# Patient Record
Sex: Female | Born: 1985 | Race: White | Hispanic: No | Smoking: Never smoker
Health system: Southern US, Community
[De-identification: ages and names within clinical notes are randomized; demographics above are authoritative.]

## PROBLEM LIST (undated history)

## (undated) DIAGNOSIS — M1990 Unspecified osteoarthritis, unspecified site: Secondary | ICD-10-CM

## (undated) HISTORY — PX: CHOLECYSTECTOMY: SHX55

---
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
## 2006-05-20 ENCOUNTER — Other Ambulatory Visit: Payer: Self-pay

## 2006-05-20 ENCOUNTER — Emergency Department: Payer: Self-pay | Admitting: Emergency Medicine

## 2006-05-21 ENCOUNTER — Emergency Department: Payer: Self-pay | Admitting: Emergency Medicine

## 2006-05-21 ENCOUNTER — Other Ambulatory Visit: Payer: Self-pay

## 2006-08-22 ENCOUNTER — Other Ambulatory Visit: Payer: Self-pay

## 2006-08-22 ENCOUNTER — Emergency Department: Payer: Self-pay | Admitting: Internal Medicine

## 2006-10-29 ENCOUNTER — Observation Stay: Payer: Self-pay

## 2006-10-30 ENCOUNTER — Ambulatory Visit: Payer: Self-pay

## 2006-10-31 ENCOUNTER — Observation Stay: Payer: Self-pay | Admitting: Obstetrics and Gynecology

## 2006-11-05 ENCOUNTER — Observation Stay: Payer: Self-pay

## 2006-11-16 ENCOUNTER — Observation Stay: Payer: Self-pay | Admitting: Certified Nurse Midwife

## 2006-12-19 ENCOUNTER — Observation Stay: Payer: Self-pay | Admitting: Obstetrics and Gynecology

## 2007-01-03 ENCOUNTER — Observation Stay: Payer: Self-pay | Admitting: Obstetrics and Gynecology

## 2007-01-09 ENCOUNTER — Observation Stay: Payer: Self-pay | Admitting: Obstetrics and Gynecology

## 2007-01-10 ENCOUNTER — Observation Stay: Payer: Self-pay | Admitting: Obstetrics and Gynecology

## 2007-01-11 ENCOUNTER — Inpatient Hospital Stay: Payer: Self-pay | Admitting: Obstetrics and Gynecology

## 2008-08-26 ENCOUNTER — Observation Stay: Payer: Self-pay | Admitting: Obstetrics and Gynecology

## 2008-09-05 ENCOUNTER — Observation Stay: Payer: Self-pay | Admitting: Unknown Physician Specialty

## 2008-09-09 ENCOUNTER — Observation Stay: Payer: Self-pay

## 2008-09-25 ENCOUNTER — Observation Stay: Payer: Self-pay | Admitting: Unknown Physician Specialty

## 2008-09-28 ENCOUNTER — Ambulatory Visit: Payer: Self-pay | Admitting: Obstetrics & Gynecology

## 2008-09-29 ENCOUNTER — Inpatient Hospital Stay: Payer: Self-pay | Admitting: Obstetrics & Gynecology

## 2011-11-30 ENCOUNTER — Encounter: Payer: Self-pay | Admitting: Obstetrics & Gynecology

## 2011-12-05 HISTORY — PX: TUBAL LIGATION: SHX77

## 2012-02-15 ENCOUNTER — Encounter: Payer: Self-pay | Admitting: Obstetrics & Gynecology

## 2012-06-18 ENCOUNTER — Observation Stay: Payer: Self-pay | Admitting: Obstetrics and Gynecology

## 2012-06-19 ENCOUNTER — Ambulatory Visit: Payer: Self-pay | Admitting: Obstetrics and Gynecology

## 2012-06-19 LAB — CBC WITH DIFFERENTIAL/PLATELET
Basophil #: 0 10*3/uL (ref 0.0–0.1)
Eosinophil %: 0.4 %
HCT: 28.3 % — ABNORMAL LOW (ref 35.0–47.0)
Lymphocyte #: 1.5 10*3/uL (ref 1.0–3.6)
Lymphocyte %: 15.9 %
MCH: 22.4 pg — ABNORMAL LOW (ref 26.0–34.0)
MCV: 71 fL — ABNORMAL LOW (ref 80–100)
Monocyte #: 0.7 x10 3/mm (ref 0.2–0.9)
Monocyte %: 7.5 %
Neutrophil #: 7.2 10*3/uL — ABNORMAL HIGH (ref 1.4–6.5)
RBC: 3.96 10*6/uL (ref 3.80–5.20)
RDW: 16.5 % — ABNORMAL HIGH (ref 11.5–14.5)
WBC: 9.5 10*3/uL (ref 3.6–11.0)

## 2012-06-20 ENCOUNTER — Inpatient Hospital Stay: Payer: Self-pay | Admitting: Obstetrics and Gynecology

## 2012-06-21 LAB — HEMATOCRIT: HCT: 24 % — ABNORMAL LOW (ref 35.0–47.0)

## 2015-03-23 NOTE — Op Note (Signed)
PATIENT NAME:  Dawn Paul, Dawn Paul MR#:  [id number] DATE OF BIRTH:  11-17-1986  DATE OF PROCEDURE:  06/20/2012  PREOPERATIVE DIAGNOSES: Term intrauterine pregnancy, prior history of cesarean section, desire for permanent sterility.      POSTOPERATIVE DIAGNOSES:  Term intrauterine pregnancy, prior history of cesarean section, desire for permanent sterility.   PROCEDURES PERFORMED:  1. Low transverse cesarean section.  2. Bilateral tubal ligation.   SURGEON: Dierdre Searles. Paul Harris, M.Paul.   ASSISTANT: Thomasene MohairStephen Jackson, M.Paul.   ANESTHESIA: Spinal.   ESTIMATED BLOOD LOSS: 250 mL.  COMPLICATIONS: None.   FINDINGS: Normal tubes, and uterus. Moderate amount of adhesions in the lower uterine segment. Delivery of viable female infant named Dawn Paul with Apgar scores of 9 and 9 and weight of 7 pounds, 5 ounces.   DISPOSITION: To the recovery room in stable condition.   TECHNIQUE: The patient is prepped and draped in the usual sterile fashion after adequate anesthesia is obtained in the supine position on the Operating Room table with a Foley catheter in place. The scalpel was used to create a low transverse skin incision through the area of the prior scar that extended down to the level of the rectus fascia which is then dissected bilaterally using Mayo scissors. The rectus muscles are separated from the rectus fascia and then separated in the midline and the peritoneum is penetrated with inferior dissection and retraction of the bladder. Adhesions are dissected to free up the lower uterine segment with no injury to bladder or other structures.   A scalpel is used to create a low transverse hysterotomy incision that is extended by blunt dissection with amniotomy then revealing clear fluid. The infant's head is grasped and delivered without the use of a vacuum suction device and the oropharynx is suctioned. The nuchal cord is reduced and the infant is then completely delivered.   Cord blood is obtained, the  placenta is manually extracted. The uterus is externalized and cleansed of all membranes and debris using a moist sponge. The hysterotomy incision is closed with a running #1 Vicryl suture in a locking fashion with a second suture placed to imbricate and obtain excellent hemostasis.   The right and left fallopian tubes are grasped in the midportion with a Babcock clamp and suture ligated and excised with the ends cauterized. Excellent hemostasis is noted at these sites. The uterus is placed back in the intra-abdominal cavity and the paracolic gutters are irrigated with warm saline. Re-examination of all incision reveals excellent hemostasis and Interceed is placed over the lower uterine segment at hysterotomy scar to minimize adhesion formation.   The peritoneum is closed with a Vicryl suture. Trocars are placed through the skin into the subfascial space and then Silver soaker catheters are fed into the subfascial space for administration of local anesthesia from the On-Q pain pump. The rectus fascia is then closed with a 0 Maxon suture and the subcutaneous tissues are irrigated and hemostasis is assured using electrocautery. Skin is closed with a 4-0 Vicryl suture in a subcuticular fashion followed by placement of Dermabond over this incision as well as where the catheters exit the skin. The two catheters are then flushed with 5 mL of 0.5% bupivacaine. The patient goes to the recovery room in stable condition. All sponge, instrument, and needle counts are correct.   ____________________________ R. Annamarie MajorPaul Harris, MD rph:ap Paul: 06/20/2012 08:55:13 ET T: 06/20/2012 10:41:01 ET JOB#: [id number]  cc: Dierdre Searles. Paul Harris, MD, <Dictator> Nadara MustardOBERT P HARRIS MD ELECTRONICALLY SIGNED 06/20/2012 10:52

## 2016-04-19 ENCOUNTER — Other Ambulatory Visit: Payer: Self-pay | Admitting: Family Medicine

## 2016-04-19 DIAGNOSIS — R1011 Right upper quadrant pain: Secondary | ICD-10-CM

## 2016-04-25 ENCOUNTER — Ambulatory Visit
Admission: RE | Admit: 2016-04-25 | Discharge: 2016-04-25 | Disposition: A | Discharge status: Home / Self Care | Payer: Medicaid Other | Source: Ambulatory Visit | Attending: Family Medicine | Admitting: Family Medicine

## 2016-04-25 DIAGNOSIS — Z9049 Acquired absence of other specified parts of digestive tract: Secondary | ICD-10-CM | POA: Diagnosis not present

## 2016-04-25 DIAGNOSIS — R1011 Right upper quadrant pain: Secondary | ICD-10-CM | POA: Diagnosis present

## 2019-06-09 ENCOUNTER — Ambulatory Visit
Admission: EM | Admit: 2019-06-09 | Discharge: 2019-06-09 | Disposition: A | Discharge status: Home / Self Care | Payer: Worker's Compensation | Attending: Family Medicine | Admitting: Family Medicine

## 2019-06-09 ENCOUNTER — Ambulatory Visit (INDEPENDENT_AMBULATORY_CARE_PROVIDER_SITE_OTHER): Payer: Worker's Compensation

## 2019-06-09 ENCOUNTER — Other Ambulatory Visit: Payer: Self-pay

## 2019-06-09 DIAGNOSIS — Z042 Encounter for examination and observation following work accident: Secondary | ICD-10-CM

## 2019-06-09 DIAGNOSIS — W231XXA Caught, crushed, jammed, or pinched between stationary objects, initial encounter: Secondary | ICD-10-CM | POA: Diagnosis not present

## 2019-06-09 DIAGNOSIS — M79644 Pain in right finger(s): Secondary | ICD-10-CM

## 2019-06-09 DIAGNOSIS — S6991XA Unspecified injury of right wrist, hand and finger(s), initial encounter: Secondary | ICD-10-CM | POA: Diagnosis not present

## 2019-06-09 HISTORY — DX: Unspecified osteoarthritis, unspecified site: M19.90

## 2019-06-09 MED ORDER — MELOXICAM 15 MG PO TABS: 15.0000 mg | ORAL_TABLET | Freq: Every day | ORAL | 0 refills | Status: DC | PRN

## 2019-06-09 NOTE — ED Provider Notes (Signed)
[facility]    CSN: [id number] Arrival date & time: 06/09/19  1200   History   Chief Complaint Chief Complaint  Patient presents with  . Finger Injury    right middle finger  . Work Related Injury    HPI  33 year old female presents with a work-related injury to her right middle finger.  Patient reports that yesterday she was at work.  She got her finger caught in a crate.  She states that she heard a pop of her right middle finger.  She endorses pain, swelling below the PIP joint.  She has soaked the area and took Tylenol without resolution.  Exacerbated by range of motion.  No relieving factors.  No other associated symptoms.  No other complaints.  PMH, Surgical Hx, Family Hx, Social History reviewed and updated as below.  Past Medical History:  Diagnosis Date  . Arthritis    Past Surgical History:  Procedure Laterality Date  . CESAREAN SECTION    . CHOLECYSTECTOMY      OB History   No obstetric history on file.      Home Medications    Prior to Admission medications   Medication Sig Start Date End Date Taking? Authorizing Provider  acetaminophen (TYLENOL) 650 MG CR tablet Take 650 mg by mouth every 8 (eight) hours as needed for pain.   Yes [provider]  albuterol (VENTOLIN HFA) 108 (90 Base) MCG/ACT inhaler Inhale into the lungs.   Yes [provider]  ASHLYNA 0.15-0.03 &0.01 MG tablet TAKE 1 TABLET BY MOUTH ONCE DAILY FOR 3 MONTHS RESTART 1 WEEK AFTER LAST PILL IS TAKEN. 03/20/19  Yes [provider]  fluticasone (FLONASE) 50 MCG/ACT nasal spray  03/28/18  Yes [provider]  meloxicam (MOBIC) 15 MG tablet Take 1 tablet (15 mg total) by mouth daily as needed for pain. 06/09/19   Tommie Samsook, Joron Velis G, DO    Family History Family History  Problem Relation Age of Onset  . Hypertension Mother   . COPD Mother   . Supraventricular tachycardia Mother   . Lupus Mother   . Diabetes Father     Social History Social History    Tobacco Use  . Smoking status: Never Smoker  . Smokeless tobacco: Never Used  Substance Use Topics  . Alcohol use: Not Currently  . Drug use: Not Currently     Allergies   Penicillins   Review of Systems Review of Systems  Constitutional: Negative.   Musculoskeletal:       Right middle finger pain.   Physical Exam Triage Vital Signs ED Triage Vitals  Enc Vitals Group     BP 06/09/19 1240 123/68     Pulse Rate 06/09/19 1240 75     Resp 06/09/19 1240 17     Temp 06/09/19 1240 98.1 F (36.7 C)     Temp Source 06/09/19 1240 Oral     SpO2 06/09/19 1240 100 %     Weight 06/09/19 1236 190 lb (86.2 kg)     Height 06/09/19 1236 5\' 6"  (1.676 m)     Head Circumference --      Peak Flow --      Pain Score 06/09/19 1235 8     Pain Loc --      Pain Edu? --      Excl. in GC? --    Updated Vital Signs BP 123/68 (BP Location: Right Arm)   Pulse 75   Temp 98.1 F (36.7 C) (Oral)  Resp 17   Ht 5\' 6"  (1.676 m)   Wt 86.2 kg   SpO2 100%   BMI 30.67 kg/m   Visual Acuity Right Eye Distance:   Left Eye Distance:   Bilateral Distance:    Right Eye Near:   Left Eye Near:    Bilateral Near:     Physical Exam Vitals signs and nursing note reviewed.  Constitutional:      General: She is not in acute distress.    Appearance: Normal appearance.  HENT:     Head: Normocephalic and atraumatic.  Eyes:     General:        Right eye: No discharge.        Left eye: No discharge.     Conjunctiva/sclera: Conjunctivae normal.  Cardiovascular:     Rate and Rhythm: Normal rate and regular rhythm.  Pulmonary:     Effort: Pulmonary effort is normal.     Breath sounds: Normal breath sounds.  Musculoskeletal:     Comments: Right middle finger with tenderness just proximal to the PIP joint and at the MCP joint.  She is able to flex the finger but not fully.  She is able to extend as well.  Skin:    General: Skin is warm.     Findings: No bruising.  Neurological:     Mental  Status: She is alert.  Psychiatric:        Mood and Affect: Mood normal.        Behavior: Behavior normal.    UC Treatments / Results  Labs (all labs ordered are listed, but only abnormal results are displayed) Labs Reviewed - No data to display  EKG   Radiology Dg Finger Middle Right  Result Date: 06/09/2019 CLINICAL DATA:  Smashed finger at work.  Pain. EXAM: RIGHT MIDDLE FINGER 2+V COMPARISON:  None. FINDINGS: There is no evidence of fracture or dislocation. There is no evidence of arthropathy or other focal bone abnormality. Soft tissues are unremarkable. IMPRESSION: Negative. Electronically Signed   By: Kathreen Devoid   On: 06/09/2019 13:40    Procedures Procedures (including critical care time)  Medications Ordered in UC Medications - No data to display  Initial Impression / Assessment and Plan / UC Course  I have reviewed the triage vital signs and the nursing notes.  Pertinent labs & imaging results that were available during my care of the patient were reviewed by me and considered in my medical decision making (see chart for details).    33 year old female presents with a finger injury.  X-ray negative.  Meloxicam as directed.  Workmen's Comp. form filled out with restrictions.  Final Clinical Impressions(s) / UC Diagnoses   Final diagnoses:  Injury of finger of right hand, initial encounter     Discharge Instructions     Medication as prescribed.  Xray negative.  Take care  Dr. Lacinda Axon    ED Prescriptions    Medication Sig Dispense Auth. Provider   meloxicam (MOBIC) 15 MG tablet Take 1 tablet (15 mg total) by mouth daily as needed for pain. 30 tablet Coral Spikes, DO     Controlled Substance Prescriptions [place] Controlled Substance Registry consulted? Not Applicable   Coral Spikes, DO 06/09/19 1438

## 2019-06-09 NOTE — ED Triage Notes (Signed)
Patient complains of right middle finger injury that occurred at work yesterday. Patient states that she was putting a crate in to the cooler and her finger got stuck in the crate, heard a popping noise, pain and swelling at knuckle. Patient states that she soaked it in epsom salt and took tylenol arthritis.

## 2019-06-09 NOTE — Discharge Instructions (Signed)
Medication as prescribed.  Xray negative.  Take care  Dr. Robel Wuertz  

## 2020-03-30 ENCOUNTER — Other Ambulatory Visit: Payer: Self-pay | Admitting: Primary Care

## 2020-03-30 DIAGNOSIS — R1011 Right upper quadrant pain: Secondary | ICD-10-CM

## 2020-04-06 ENCOUNTER — Other Ambulatory Visit: Payer: Self-pay

## 2020-04-06 ENCOUNTER — Ambulatory Visit
Admission: RE | Admit: 2020-04-06 | Discharge: 2020-04-06 | Disposition: A | Discharge status: Home / Self Care | Payer: Medicaid Other | Source: Ambulatory Visit | Attending: Primary Care | Admitting: Primary Care

## 2020-04-06 DIAGNOSIS — R1011 Right upper quadrant pain: Secondary | ICD-10-CM | POA: Diagnosis not present

## 2020-04-12 ENCOUNTER — Encounter: Payer: Self-pay | Admitting: Obstetrics and Gynecology

## 2020-04-26 ENCOUNTER — Encounter: Payer: Self-pay | Admitting: Obstetrics and Gynecology

## 2020-05-25 ENCOUNTER — Encounter: Payer: Self-pay | Admitting: Obstetrics and Gynecology

## 2020-05-25 ENCOUNTER — Other Ambulatory Visit: Payer: Self-pay

## 2020-05-25 ENCOUNTER — Ambulatory Visit (INDEPENDENT_AMBULATORY_CARE_PROVIDER_SITE_OTHER): Payer: Medicaid Other | Admitting: Obstetrics and Gynecology

## 2020-05-25 ENCOUNTER — Other Ambulatory Visit (HOSPITAL_COMMUNITY)
Admission: RE | Admit: 2020-05-25 | Discharge: 2020-05-25 | Disposition: A | Discharge status: Home / Self Care | Payer: Medicaid Other | Source: Ambulatory Visit | Attending: Obstetrics and Gynecology | Admitting: Obstetrics and Gynecology

## 2020-05-25 VITALS — BP 110/66 | Wt 180.0 lb

## 2020-05-25 DIAGNOSIS — Z124 Encounter for screening for malignant neoplasm of cervix: Secondary | ICD-10-CM | POA: Diagnosis present

## 2020-05-25 DIAGNOSIS — Z113 Encounter for screening for infections with a predominantly sexual mode of transmission: Secondary | ICD-10-CM | POA: Diagnosis present

## 2020-05-25 DIAGNOSIS — N939 Abnormal uterine and vaginal bleeding, unspecified: Secondary | ICD-10-CM | POA: Diagnosis not present

## 2020-05-25 NOTE — Progress Notes (Signed)
Gynecology Abnormal Uterine Bleeding Initial Evaluation   Chief Complaint:  Chief Complaint  Patient presents with  . Menorrhagia    Referred by [facility] center    History of Present Illness:    Dawn Paul is a 34 y.o. [id number] who LMP was Patient's last menstrual period was 03/28/2020., presents today for a problem visit.  She complains of menorrhagia that  began several years ago and its severity is described as severe.  The patient menstrual complaints are chronic present for the oast 6 months but with recent acute exacerbation. She has regular menstrual cycles and they are associated with severe menstrual cramping.  She has used the following for attempts at control: tampon and pad. The patient is sexually active. She currently uses tubal ligationfor contraception.    Previous evaluation: No recent uterine imaging, last 2017 showing no evidence of fibroids, 5.21mm endometrial stripe, and normal adnexa.  Normal endometrial biopsy in 2017 showing secretory endometrium Prior Diagnosis: N/A. Previous Treatment: was placed on Lysteda in 2017  LMP: Patient's last menstrual period was 03/28/2020.   Paramter Normal / Abnormal Prsent  Frequency Amenoorhea     Infrequent (>38 days)     Normal (?24 days ?38 days) X   Freequent (<24 days)    Duration Normal (?8 days) X   Prolonged (>8 days)    Regularity Regular (shortest to longest cycle variation ?7-9 days)* X   Irregular (shortest to longest cycle variation ?8-10days)*    Flow Volume Light    (Self reported) Normal     Heavy X      Intermenstrual Bleeding None X   Random     Cyclical early     Cyclical mid     Cyclical late        Unscheduled Bleeding  Not applicable X  (exogenous hormones) Absent     Present     FIGO AUB I System: *The available evidence suggests that, using these criteria, the normal range (shortest to longest) varies with age: 71-25 y of age, ?18 d; 47-41 y, ?7 d; and for 13-45 y, ?9  d    Review of Systems: Review of Systems  Constitutional: Negative.   Gastrointestinal: Positive for abdominal pain.  Genitourinary: Negative.   Endo/Heme/Allergies: Does not bruise/bleed easily.    Past Medical History:  There are no problems to display for this patient.   Past Surgical History:  Past Surgical History:  Procedure Laterality Date  . CESAREAN SECTION    . CHOLECYSTECTOMY    . TUBAL LIGATION  2013    Obstetric History: [id number]  Family History:  Family History  Problem Relation Age of Onset  . Hypertension Mother   . COPD Mother   . Supraventricular tachycardia Mother   . Lupus Mother   . Diabetes Father     Social History:  Social History   Socioeconomic History  . Marital status: Divorced    Spouse name: Not on file  . Number of children: Not on file  . Years of education: Not on file  . Highest education level: Not on file  Occupational History  . Not on file  Tobacco Use  . Smoking status: Never Smoker  . Smokeless tobacco: Never Used  Vaping Use  . Vaping Use: Never used  Substance and Sexual Activity  . Alcohol use: Not Currently  . Drug use: Not Currently  . Sexual activity: Yes    Birth control/protection: Pill  Other Topics Concern  . Not on  file  Social History Narrative  . Not on file   Social Determinants of Health   Financial Resource Strain:   . Difficulty of Paying Living Expenses:   Food Insecurity:   . Worried About Charity fundraiser in the Last Year:   . Arboriculturist in the Last Year:   Transportation Needs:   . Film/video editor (Medical):   Marland Kitchen Lack of Transportation (Non-Medical):   Physical Activity:   . Days of Exercise per Week:   . Minutes of Exercise per Session:   Stress:   . Feeling of Stress :   Social Connections:   . Frequency of Communication with Friends and Family:   . Frequency of Social Gatherings with Friends and Family:   . Attends Religious Services:   . Active Member of Clubs  or Organizations:   . Attends Archivist Meetings:   Marland Kitchen Marital Status:   Intimate Partner Violence:   . Fear of Current or Ex-Partner:   . Emotionally Abused:   Marland Kitchen Physically Abused:   . Sexually Abused:     Allergies:  Allergies  Allergen Reactions  . Penicillins Swelling and Other (See Comments)    Other reaction(s): SWELLING/EDEMA     Medications: Prior to Admission medications   Medication Sig Start Date End Date Taking? Authorizing Provider  ASHLYNA 0.15-0.03 &0.01 MG tablet TAKE 1 TABLET BY MOUTH ONCE DAILY FOR 3 MONTHS RESTART 1 WEEK AFTER LAST PILL IS TAKEN. 03/20/19  Yes [provider]  fluticasone (FLONASE) 50 MCG/ACT nasal spray  03/28/18  Yes [provider]  meloxicam (MOBIC) 15 MG tablet Take 1 tablet (15 mg total) by mouth daily as needed for pain. 06/09/19  Yes Coral Spikes, DO    Physical Exam Blood pressure 110/66, weight 180 lb (81.6 kg), last menstrual period 03/28/2020.  Patient's last menstrual period was 03/28/2020.  General: NAD HEENT: normocephalic, anicteric Thyroid: no enlargement, no palpable nodules Pulmonary: No increased work of breathing Cardiovascular: RRR, distal pulses 2+ Abdomen: NABS, soft, non-tender, non-distended.  Umbilicus without lesions.  No hepatomegaly, splenomegaly or masses palpable. No evidence of hernia  Genitourinary:  External: Normal external female genitalia.  Normal urethral meatus, normal Bartholin's and Skene's glands.    Vagina: Normal vaginal mucosa, no evidence of prolapse.    Cervix: Grossly normal in appearance, no bleeding  Uterus: Enlarged 14 weeks size, anteverted, mobile, slightly irregular contour.  No CMT  Adnexa: ovaries non-enlarged, no adnexal masses  Rectal: deferred  Lymphatic: no evidence of inguinal lymphadenopathy Extremities: no edema, erythema, or tenderness Neurologic: Grossly intact Psychiatric: mood appropriate, affect full  Female chaperone present for pelvic  portions of the physical exam  Assessment: 34 y.o. [id number] with abnormal uterine bleeding  Plan: Problem List Items Addressed This Visit    None    Visit Diagnoses    Abnormal uterine bleeding    -  Primary   Relevant Orders   TSH+Prl+FSH+TestT+LH+DHEA S...   US Transvaginal Non-OB   Screening for malignant neoplasm of cervix       Relevant Orders   Cytology - PAP   Routine screening for STI (sexually transmitted infection)       Relevant Orders   Cytology - PAP      1) Discussed management options for abnormal uterine bleeding including expectant, NSAIDs, tranexamic acid (Lysteda), oral progesterone (Provera, norethindrone, megace), Depo Provera, Levonorgestrel containing IUD, endometrial ablation (Novasure) or hysterectomy as definitive surgical management.  Discussed risks and benefits  of each method.   Final management decision will hinge on results of patient's work up and whether an underlying etiology for the patients bleeding symptoms can be discerned.  We will conduct a basic work up examining using the PALM-COIEN classification system. .  The role of unopposed estrogen in the development of endometrial hyperplasia or carcinoma is discussed.  The risk of endometrial hyperplasia is linearly correlated with increasing BMI given the production of estrone by adipose tissue. Printed patient education handouts were given to the patient to review at home.  Bleeding precautions reviewed.  - pap obtained today - will obtain imaging to further evaluate for structural lesions such as uterine fibroids - treatment option such as progestin only options (POP, IUD, depo provera) discussed given migraine with auro not candidate for OCP, endometrial ablation, ponstel, lysteda (previously tried), or hysterectomy.  If uterine fibroids visualized [place] may also be an option. - In the setting of normal imaging suspicion for adenomyosis high given history of prior cesarean sections.  2) Return in about 1  week (around 06/01/2020) for 1-2 week TVUS and gyn follow up.   Vena Austria, MD, [facility], [facility] 05/25/2020, 3:16 PM

## 2020-05-28 LAB — CYTOLOGY - PAP
Comment: NEGATIVE
Diagnosis: NEGATIVE
High risk HPV: NEGATIVE

## 2020-06-01 LAB — TSH+PRL+FSH+TESTT+LH+DHEA S...
17-Hydroxyprogesterone: <10 ng/dL
Androstenedione: <25 ng/dL — ABNORMAL LOW (ref 41–262)
DHEA-SO4: 74.7 ug/dL — ABNORMAL LOW (ref 84.8–378.0)
FSH: <0.3 m[IU]/mL
LH: <0.3 m[IU]/mL
Prolactin: 6.9 ng/mL (ref 4.8–23.3)
TSH: 1.36 u[IU]/mL (ref 0.450–4.500)
Testosterone, Free: 0.5 pg/mL (ref 0.0–4.2)
Testosterone: <3 ng/dL — ABNORMAL LOW (ref 8–60)

## 2020-06-15 ENCOUNTER — Other Ambulatory Visit: Payer: Self-pay | Admitting: Obstetrics and Gynecology

## 2020-06-15 ENCOUNTER — Other Ambulatory Visit: Payer: Self-pay

## 2020-06-15 ENCOUNTER — Ambulatory Visit (INDEPENDENT_AMBULATORY_CARE_PROVIDER_SITE_OTHER): Payer: Medicaid Other

## 2020-06-15 ENCOUNTER — Encounter: Payer: Self-pay | Admitting: Obstetrics and Gynecology

## 2020-06-15 ENCOUNTER — Ambulatory Visit (INDEPENDENT_AMBULATORY_CARE_PROVIDER_SITE_OTHER): Payer: Medicaid Other | Admitting: Obstetrics and Gynecology

## 2020-06-15 VITALS — BP 112/68 | HR 78 | Ht 66.0 in | Wt 179.0 lb

## 2020-06-15 DIAGNOSIS — N939 Abnormal uterine and vaginal bleeding, unspecified: Secondary | ICD-10-CM

## 2020-06-15 DIAGNOSIS — N946 Dysmenorrhea, unspecified: Secondary | ICD-10-CM

## 2020-06-15 MED ORDER — ASHLYNA 0.15-0.03 &0.01 MG PO TABS: 1.0000 | ORAL_TABLET | Freq: Every day | ORAL | 3 refills | Status: DC

## 2020-06-15 NOTE — Progress Notes (Signed)
Gynecology Ultrasound Follow Up  Chief Complaint:  Chief Complaint  Patient presents with  . Follow-up    GYN ultrasound     History of Present Illness: Patient is a 34 y.o. female who presents today for ultrasound evaluation of abnormal uterine bleeding dysmenorrhyea .  Ultrasound demonstrates the following findgins Adnexa: no masses seen  Uterus: Non-enlarged with endometrial stripe  11 mm Additional: No free fluid  Review of Systems: Review of Systems  Constitutional: Negative.   Gastrointestinal: Negative for nausea and vomiting.  Neurological: Positive for headaches.    Past Medical History:  Past Medical History:  Diagnosis Date  . Arthritis     Past Surgical History:  Past Surgical History:  Procedure Laterality Date  . CESAREAN SECTION    . CHOLECYSTECTOMY    . TUBAL LIGATION  2013    Gynecologic History:  No LMP recorded. (Menstrual status: Oral contraceptives). Contraception: tubal ligation Last Pap: 05/25/2020. Results were: .NILM HPV negative  Family History:  Family History  Problem Relation Age of Onset  . Hypertension Mother   . COPD Mother   . Supraventricular tachycardia Mother   . Lupus Mother   . Diabetes Father     Social History:  Social History   Socioeconomic History  . Marital status: Divorced    Spouse name: Not on file  . Number of children: Not on file  . Years of education: Not on file  . Highest education level: Not on file  Occupational History  . Not on file  Tobacco Use  . Smoking status: Never Smoker  . Smokeless tobacco: Never Used  Vaping Use  . Vaping Use: Never used  Substance and Sexual Activity  . Alcohol use: Not Currently  . Drug use: Not Currently  . Sexual activity: Yes    Birth control/protection: Pill  Other Topics Concern  . Not on file  Social History Narrative  . Not on file   Social Determinants of Health   Financial Resource Strain:   . Difficulty of Paying Living Expenses:   Food  Insecurity:   . Worried About Programme researcher, broadcasting/film/video in the Last Year:   . Barista in the Last Year:   Transportation Needs:   . Freight forwarder (Medical):   Marland Kitchen Lack of Transportation (Non-Medical):   Physical Activity:   . Days of Exercise per Week:   . Minutes of Exercise per Session:   Stress:   . Feeling of Stress :   Social Connections:   . Frequency of Communication with Friends and Family:   . Frequency of Social Gatherings with Friends and Family:   . Attends Religious Services:   . Active Member of Clubs or Organizations:   . Attends Banker Meetings:   Marland Kitchen Marital Status:   Intimate Partner Violence:   . Fear of Current or Ex-Partner:   . Emotionally Abused:   Marland Kitchen Physically Abused:   . Sexually Abused:     Allergies:  Allergies  Allergen Reactions  . Penicillins Swelling and Other (See Comments)    Other reaction(s): SWELLING/EDEMA     Medications: Prior to Admission medications   Medication Sig Start Date End Date Taking? Authorizing Provider  ASHLYNA 0.15-0.03 &0.01 MG tablet TAKE 1 TABLET BY MOUTH ONCE DAILY FOR 3 MONTHS RESTART 1 WEEK AFTER LAST PILL IS TAKEN. 03/20/19  Yes [provider]  fluticasone (FLONASE) 50 MCG/ACT nasal spray  03/28/18  Yes [provider]  pantoprazole (  PROTONIX) 40 MG tablet Take 40 mg by mouth daily. 05/28/20  Yes [provider]    Physical Exam Vitals: Blood pressure 112/68, pulse 78, height 5\' 6"  (1.676 m), weight 179 lb (81.2 kg).  General: NAD HEENT: normocephalic, anicteric Pulmonary: No increased work of breathing Extremities: no edema, erythema, or tenderness Neurologic: Grossly intact, normal gait Psychiatric: mood appropriate, affect full   Assessment: 34 y.o. 33 No problem-specific Assessment & Plan notes found for this encounter.   Plan: Problem List Items Addressed This Visit    None      1) Discussed option including expectant, ponstel, progestin only  contraceptives including IUD, ablation, and hysterectomy.  At present opts for continuous dosed current OCP will assess response in 2 months.  She has been on her current contraceptive for sometime with no worsening in menstrual migraines.  This already is an extended cycle pill and we discussed switching to po progestin if inadequate response.  Given normal imaging and labs highest suspicion is for adenomyosis or endometriosis given dysmenorrhea.  Previously tried lysteda,  2) A total of 15 minutes were spent in face-to-face contact with the patient during this encounter with over half of that time devoted to counseling and coordination of care.  3) Return in about 2 months (around 08/16/2020) for medication follow up in person or phone.    08/18/2020, MD, Vena Austria OB[facility], [facility] 06/15/2020, 4:26 PM

## 2020-08-16 ENCOUNTER — Ambulatory Visit (INDEPENDENT_AMBULATORY_CARE_PROVIDER_SITE_OTHER): Payer: Medicaid Other | Admitting: Obstetrics and Gynecology

## 2020-08-16 ENCOUNTER — Other Ambulatory Visit: Payer: Self-pay

## 2020-08-16 ENCOUNTER — Encounter: Payer: Self-pay | Admitting: Obstetrics and Gynecology

## 2020-08-16 VITALS — BP 120/76 | HR 99 | Wt 183.0 lb

## 2020-08-16 DIAGNOSIS — N946 Dysmenorrhea, unspecified: Secondary | ICD-10-CM

## 2020-08-16 MED ORDER — NORETHINDRONE ACETATE 5 MG PO TABS: 5.0000 mg | ORAL_TABLET | Freq: Every day | ORAL | 11 refills | Status: DC

## 2020-08-16 NOTE — Progress Notes (Signed)
Obstetrics & Gynecology Office Visit   Chief Complaint:  Chief Complaint  Patient presents with  . Follow-up    History of Present Illness: 34 y.o. [id number] presenting for medication follow up for a diagnosis of dysmenorrhea.  She is currently being managed with continuous OCP.   The patient reports good control of symptoms on her current regimen.  On her current medication regimen has achieved good cycle control, improvement in dysmenorrhea and dysparunia.   She has noted side-effects, namely worsening headaches as well as an increase in ACNE.    Review of Systems: Review of Systems  Constitutional: Negative.   Gastrointestinal: Negative.   Genitourinary: Negative.   Skin: Positive for rash. Negative for itching.     Past Medical History:  Past Medical History:  Diagnosis Date  . Arthritis     Past Surgical History:  Past Surgical History:  Procedure Laterality Date  . CESAREAN SECTION    . CHOLECYSTECTOMY    . TUBAL LIGATION  2013    Gynecologic History: No LMP recorded. (Menstrual status: Oral contraceptives).  Obstetric History: [id number]  Family History:  Family History  Problem Relation Age of Onset  . Hypertension Mother   . COPD Mother   . Supraventricular tachycardia Mother   . Lupus Mother   . Diabetes Father     Social History:  Social History   Socioeconomic History  . Marital status: Divorced    Spouse name: Not on file  . Number of children: Not on file  . Years of education: Not on file  . Highest education level: Not on file  Occupational History  . Not on file  Tobacco Use  . Smoking status: Never Smoker  . Smokeless tobacco: Never Used  Vaping Use  . Vaping Use: Never used  Substance and Sexual Activity  . Alcohol use: Not Currently  . Drug use: Not Currently  . Sexual activity: Yes    Birth control/protection: Pill  Other Topics Concern  . Not on file  Social History Narrative  . Not on file   Social Determinants of  Health   Financial Resource Strain:   . Difficulty of Paying Living Expenses: Not on file  Food Insecurity:   . Worried About Programme researcher, broadcasting/film/video in the Last Year: Not on file  . Ran Out of Food in the Last Year: Not on file  Transportation Needs:   . Lack of Transportation (Medical): Not on file  . Lack of Transportation (Non-Medical): Not on file  Physical Activity:   . Days of Exercise per Week: Not on file  . Minutes of Exercise per Session: Not on file  Stress:   . Feeling of Stress : Not on file  Social Connections:   . Frequency of Communication with Friends and Family: Not on file  . Frequency of Social Gatherings with Friends and Family: Not on file  . Attends Religious Services: Not on file  . Active Member of Clubs or Organizations: Not on file  . Attends Banker Meetings: Not on file  . Marital Status: Not on file  Intimate Partner Violence:   . Fear of Current or Ex-Partner: Not on file  . Emotionally Abused: Not on file  . Physically Abused: Not on file  . Sexually Abused: Not on file    Allergies:  Allergies  Allergen Reactions  . Penicillins Swelling and Other (See Comments)    Other reaction(s): SWELLING/EDEMA     Medications: Prior to Admission  medications   Medication Sig Start Date End Date Taking? Authorizing Provider  ASHLYNA 0.15-0.03 &0.01 MG tablet Take 1 tablet by mouth daily. Skip placebo pill and start next pill pack 06/15/20  Yes Vena Austria, MD  fluticasone [facility]) 50 MCG/ACT nasal spray  03/28/18  Yes [provider]  pantoprazole (PROTONIX) 40 MG tablet Take 40 mg by mouth daily. 05/28/20  Yes [provider]    Physical Exam Vitals:  Vitals:   08/16/20 0917  BP: 120/76  Pulse: 99   No LMP recorded. (Menstrual status: Oral contraceptives).  General: NAD, well nourished, appears stated age HEENT: normocephalic, anicteric Pulmonary: No increased work of breathing Neurologic: Grossly  intact Psychiatric: mood appropriate, affect full  Female chaperone present for pelvic  portions of the physical exam  Assessment: 34 y.o. G3P3003 follow up dysmenorrhea  Plan: Problem List Items Addressed This Visit    None    Visit Diagnoses    Dysmenorrhea    -  Primary     1) Dysmenorrhea - improvement of symptoms with extended cycle pill but worsening migraines as well as ACNE noted by patient.  Still some mild dysparunia but improvement overall.  Will switch to norethindrone to see if improvement in migraines on progestin on ly treatment  2) A total of 15 minutes were spent in face-to-face contact with the patient during this encounter with over half of that time devoted to counseling and coordination of care.  3) Return in about 3 months (around 11/15/2020) for medication follow up (phone or in person).   Vena Austria, MD, [facility], [facility] 08/16/2020, 9:49 AM

## 2020-10-01 ENCOUNTER — Other Ambulatory Visit: Payer: Self-pay | Admitting: Obstetrics and Gynecology

## 2020-10-01 ENCOUNTER — Telehealth: Payer: Self-pay

## 2020-10-01 MED ORDER — LEVONORGEST-ETH ESTRAD 91-DAY 0.15-0.03 &0.01 MG PO TABS: 1.0000 | ORAL_TABLET | Freq: Every day | ORAL | 3 refills | Status: AC

## 2020-10-01 NOTE — Telephone Encounter (Signed)
Rx is in

## 2020-10-01 NOTE — Telephone Encounter (Signed)
Patient has been on Aygestin for 2 months. She doesn't like it and would like to go back on Ashlyna. Requesting rx. RV#[phone]

## 2020-10-04 NOTE — Telephone Encounter (Signed)
LMVM to notify patient. 

## 2020-11-15 ENCOUNTER — Encounter: Payer: Self-pay | Admitting: Obstetrics and Gynecology

## 2020-11-15 ENCOUNTER — Ambulatory Visit (INDEPENDENT_AMBULATORY_CARE_PROVIDER_SITE_OTHER): Payer: Medicaid Other | Admitting: Obstetrics and Gynecology

## 2020-11-15 ENCOUNTER — Other Ambulatory Visit: Payer: Self-pay

## 2020-11-15 VITALS — BP 118/72 | Ht 66.0 in | Wt 191.0 lb

## 2020-11-15 DIAGNOSIS — K5900 Constipation, unspecified: Secondary | ICD-10-CM

## 2020-11-15 DIAGNOSIS — R5383 Other fatigue: Secondary | ICD-10-CM

## 2020-11-15 DIAGNOSIS — Z1321 Encounter for screening for nutritional disorder: Secondary | ICD-10-CM

## 2020-11-15 DIAGNOSIS — L853 Xerosis cutis: Secondary | ICD-10-CM

## 2020-11-15 DIAGNOSIS — R635 Abnormal weight gain: Secondary | ICD-10-CM

## 2020-11-15 DIAGNOSIS — R102 Pelvic and perineal pain: Secondary | ICD-10-CM

## 2020-11-15 DIAGNOSIS — N946 Dysmenorrhea, unspecified: Secondary | ICD-10-CM

## 2020-11-15 DIAGNOSIS — Z8349 Family history of other endocrine, nutritional and metabolic diseases: Secondary | ICD-10-CM

## 2020-11-15 DIAGNOSIS — N939 Abnormal uterine and vaginal bleeding, unspecified: Secondary | ICD-10-CM

## 2020-11-15 DIAGNOSIS — Z1329 Encounter for screening for other suspected endocrine disorder: Secondary | ICD-10-CM

## 2020-11-15 NOTE — Progress Notes (Signed)
Obstetrics & Gynecology Office Visit   Chief Complaint:  Chief Complaint  Patient presents with  . Follow-up    F/U medication - RM 4    History of Present Illness: 34 y.o. [id number] presenting for medication follow up for a diagnosis of dysmenorrhea and pelvic pain.  She is currently being managed with norethindrone.   The patient reports acute worsening in symptoms, has had continued breakthrough bleeding on norethindrone and switched back to extended cycle OCP.  On switching back to OCP bleeding subsided.  Has not had any issues with migraines since restarting OCP.   She has noted new symptoms. Fatigue has become fairly prominent, initially attributed to the 2 weeks of breakthrough bleeding.  Also noted dry skin, weight gain, and constipation.  Her mother has history of thyroid problems.     Review of Systems: Review of Systems  Constitutional: Positive for malaise/fatigue. Negative for chills, diaphoresis, fever and weight loss.  Gastrointestinal: Negative.   Genitourinary: Negative.   Skin: Positive for rash.     Past Medical History:  Past Medical History:  Diagnosis Date  . Arthritis     Past Surgical History:  Past Surgical History:  Procedure Laterality Date  . CESAREAN SECTION    . CHOLECYSTECTOMY    . TUBAL LIGATION  2013    Gynecologic History: No LMP recorded. (Menstrual status: Oral contraceptives).  Obstetric History: [id number]  Family History:  Family History  Problem Relation Age of Onset  . Hypertension Mother   . COPD Mother   . Supraventricular tachycardia Mother   . Lupus Mother   . Diabetes Father     Social History:  Social History   Socioeconomic History  . Marital status: Divorced    Spouse name: Not on file  . Number of children: Not on file  . Years of education: Not on file  . Highest education level: Not on file  Occupational History  . Not on file  Tobacco Use  . Smoking status: Never Smoker  . Smokeless tobacco: Never Used   Vaping Use  . Vaping Use: Never used  Substance and Sexual Activity  . Alcohol use: Not Currently  . Drug use: Not Currently  . Sexual activity: Yes    Birth control/protection: Pill  Other Topics Concern  . Not on file  Social History Narrative  . Not on file   Social Determinants of Health   Financial Resource Strain: Not on file  Food Insecurity: Not on file  Transportation Needs: Not on file  Physical Activity: Not on file  Stress: Not on file  Social Connections: Not on file  Intimate Partner Violence: Not on file    Allergies:  Allergies  Allergen Reactions  . Penicillins Swelling and Other (See Comments)    Other reaction(s): SWELLING/EDEMA     Medications: Prior to Admission medications   Medication Sig Start Date End Date Taking? Authorizing Provider  fluticasone (FLONASE) 50 MCG/ACT nasal spray  03/28/18  Yes [provider]  Levonorgestrel-Ethinyl Estradiol (ASHLYNA) 0.15-0.03 &0.01 MG tablet Take 1 tablet by mouth daily. 10/01/20  Yes Vena Austria, MD  pantoprazole (PROTONIX) 40 MG tablet Take 40 mg by mouth daily. 05/28/20  Yes [provider]    Physical Exam Vitals:  Vitals:   11/15/20 0916  BP: 118/72   No LMP recorded. (Menstrual status: Oral contraceptives).  General: NAD HEENT: normocephalic, anicteric Pulmonary: No increased work of breathing Neurologic: Grossly intact Psychiatric: mood appropriate, affect full  Female chaperone  present for pelvic  portions of the physical exam  Assessment: 34 y.o. G3P3003 pelvic pain and dysmenorreha  Plan: Problem List Items Addressed This Visit   None   Visit Diagnoses    Other fatigue    -  Primary   Relevant Orders   TSH   B12   Monospot   Weight gain       Relevant Orders   TSH   Constipation, unspecified constipation type       Relevant Orders   TSH   Dry skin       Relevant Orders   TSH   Family history of hypothyroidism       Relevant Orders   TSH    Thyroid disorder screening       Relevant Orders   TSH   Abnormal uterine bleeding       Relevant Orders   TSH   CBC   Encounter for vitamin deficiency screening       Relevant Orders   B12   Pelvic pain in female       Dysmenorrhea         Had continued bleeding on norethindrone  Fatigue, weight gain, constipation, dry skin and family history of hypothyroidism in mom  Back on extended cycle pill would like to proceed with diagnostic laparoscopy if postive for endometriosis Lupron or Orillisa.  If negative considering hysterectomy for primary dysmenorrhea  A total of 33 minutes were spent in face-to-face contact with the patient during this encounter with over half of that time devoted to counseling and coordination of care.     Vena Austria, MD, [facility], [facility] 11/15/2020, 9:43 AM

## 2020-11-16 LAB — CBC
Hematocrit: 41.4 % (ref 34.0–46.6)
Hemoglobin: 13.3 g/dL (ref 11.1–15.9)
MCH: 27.4 pg (ref 26.6–33.0)
MCHC: 32.1 g/dL (ref 31.5–35.7)
MCV: 85 fL (ref 79–97)
Platelets: 325 10*3/uL (ref 150–450)
RBC: 4.85 x10E6/uL (ref 3.77–5.28)
RDW: 12.7 % (ref 11.7–15.4)
WBC: 7.9 10*3/uL (ref 3.4–10.8)

## 2020-11-16 LAB — TSH: TSH: 3.15 u[IU]/mL (ref 0.450–4.500)

## 2020-11-16 LAB — MONONUCLEOSIS SCREEN: Mono Screen: NEGATIVE

## 2020-11-16 LAB — VITAMIN B12: Vitamin B-12: 272 pg/mL (ref 232–1245)

## 2020-11-25 ENCOUNTER — Telehealth: Payer: Self-pay | Admitting: Obstetrics and Gynecology

## 2020-11-25 NOTE — Telephone Encounter (Signed)
Called patient to schedule diagnostic laparoscopy w Dawn Paul 12/14/20  H&P 1/4 @ 11:30   Covid testing 1/7 @ 8-10:30, [address], drive up and wear mask. Advised pt to quarantine until DOS.  Pre-admit phone call appointment to be requested - date and time will be included on H&P paper work. Also all appointments will be updated on pt MyChart. Explained that this appointment has a call window. Based on the time scheduled will indicate if the call will be received within a 4 hour window before 1:00 or after.  Advised that pt may also receive calls from the hospital pharmacy and pre-service center.  Confirmed pt has [employer] as Editor, commissioning. No secondary insurance.

## 2020-11-25 NOTE — Telephone Encounter (Signed)
-----   Message from Vena Austria, MD sent at 11/15/2020  2:19 PM EST ----- Regarding: Surgery Surgery Booking Request Patient Full Name:  Dawn Paul  MRN: [id number]  DOB: 1986/07/21  Surgeon: Vena Austria, MD  Requested Surgery Date and Time: 2-6 weeks Primary Diagnosis AND Code: Pelvic pain Secondary Diagnosis and Code:  Dysmenorehea Surgical Procedure: Diagnostic Laparoscopy RNFA Requested?: No L&D Notification: No Admission Status: same day surgery Length of Surgery: 50 min Special Case Needs: No H&P: Yes Phone Interview???:  Yes Interpreter: No Medical Clearance:  No Special Scheduling Instructions: No Any known health/anesthesia issues, diabetes, sleep apnea, latex allergy, defibrillator/pacemaker?: No Acuity: P3   (P1 highest, P2 delay may cause harm, P3 low, elective gyn, P4 lowest)

## 2020-12-07 ENCOUNTER — Encounter: Payer: Medicaid Other | Admitting: Obstetrics and Gynecology

## 2020-12-08 ENCOUNTER — Other Ambulatory Visit: Payer: Medicaid Other

## 2020-12-10 ENCOUNTER — Other Ambulatory Visit: Admission: RE | Admit: 2020-12-10 | Payer: Medicaid Other | Source: Ambulatory Visit

## 2020-12-14 ENCOUNTER — Ambulatory Visit: Admit: 2020-12-14 | Payer: Medicaid Other | Admitting: Obstetrics and Gynecology

## 2020-12-14 SURGERY — LAPAROSCOPY, DIAGNOSTIC
Anesthesia: Choice

## 2021-08-04 IMAGING — US US ABDOMEN COMPLETE
1 series · 14 of 25 positions shown · non-contrast
Comparison: None.

CLINICAL DATA: 34-year-old female with abdominal pain for 1 month.
History of cholecystectomy.

EXAM:
ABDOMEN ULTRASOUND COMPLETE

[Series 1: us abdomen complete · 14 of 53 slices shown]
[im 1/53]
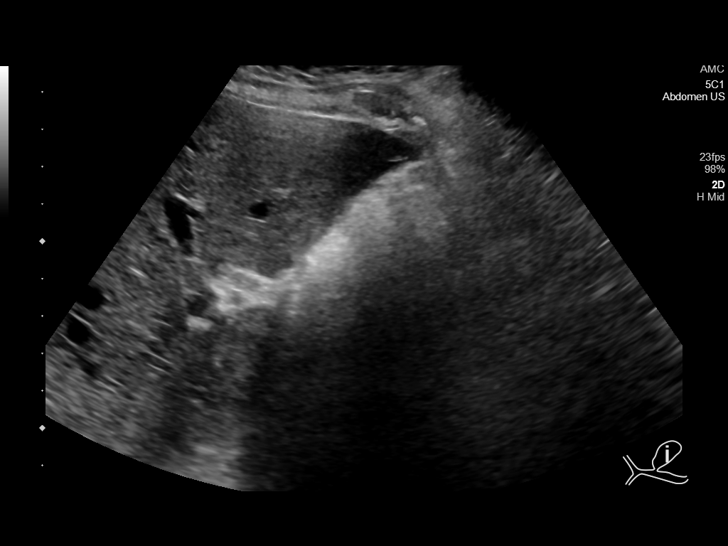
[im 5/53]
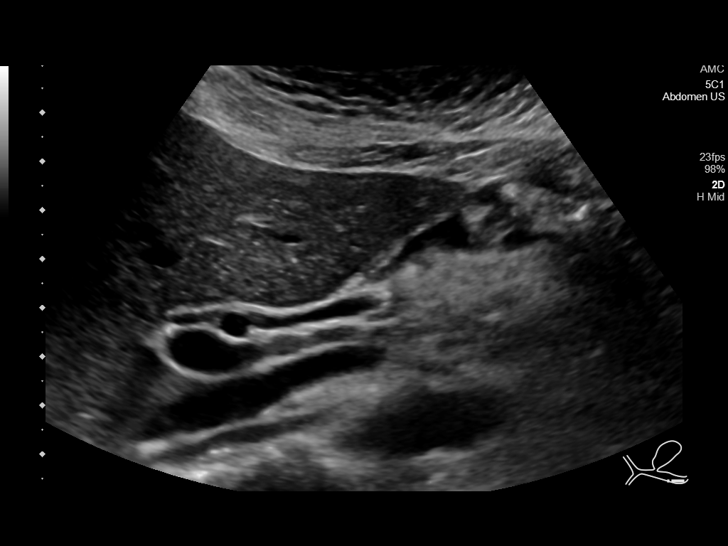
[im 9/53]
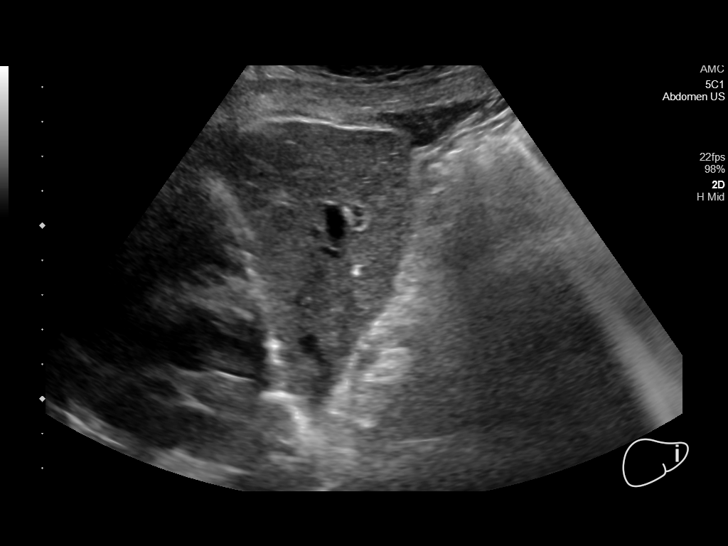
[im 14/53]
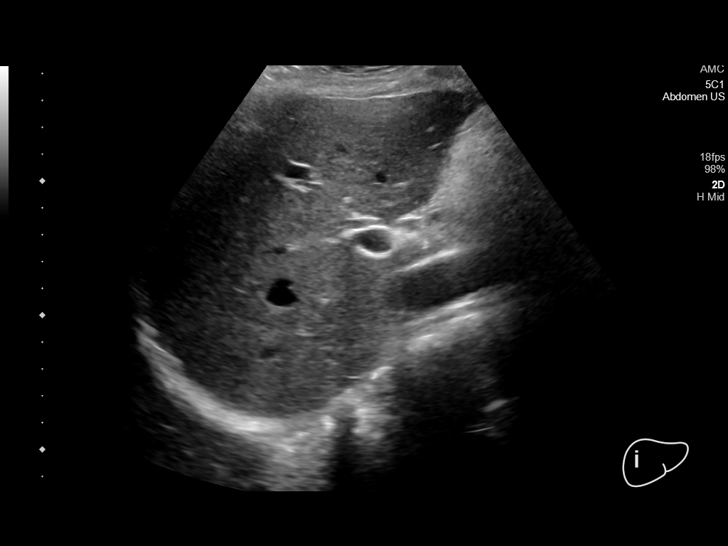
[im 18/53]
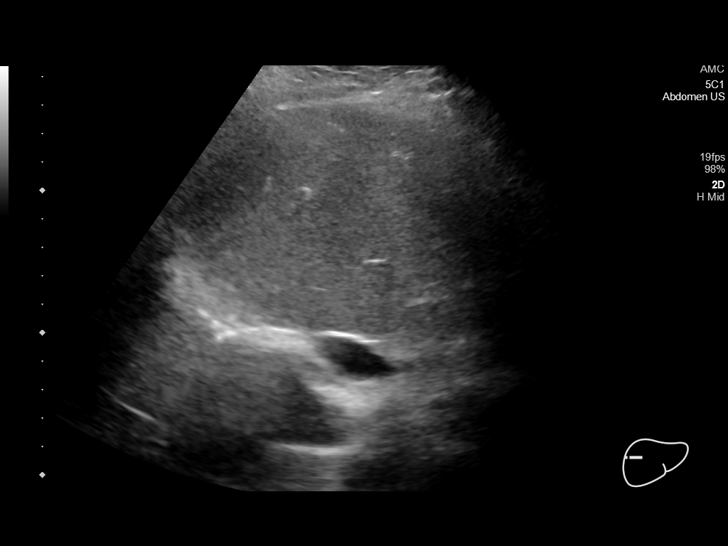
[im 20/53]
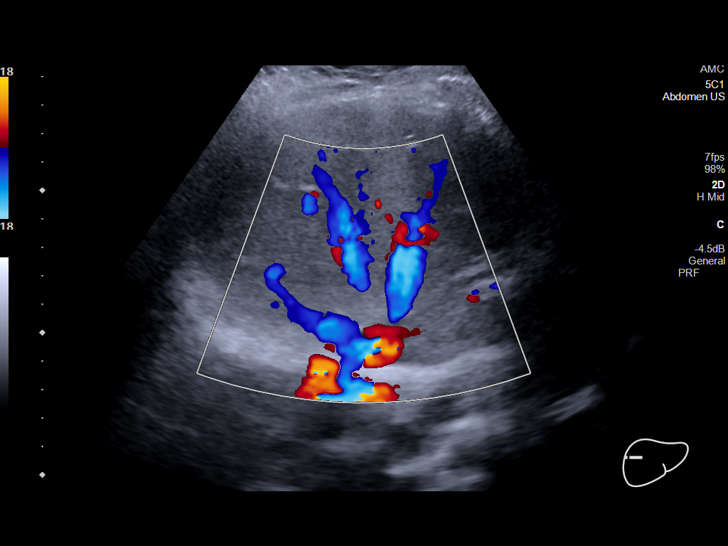
[im 24/53]
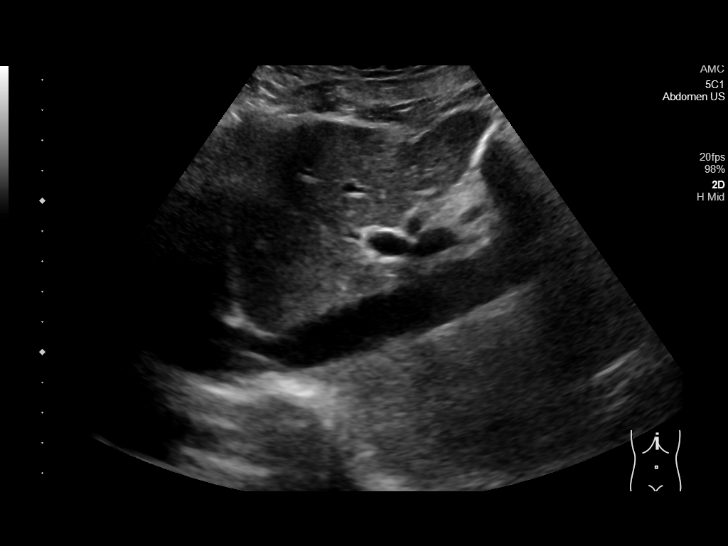
[im 29/53]
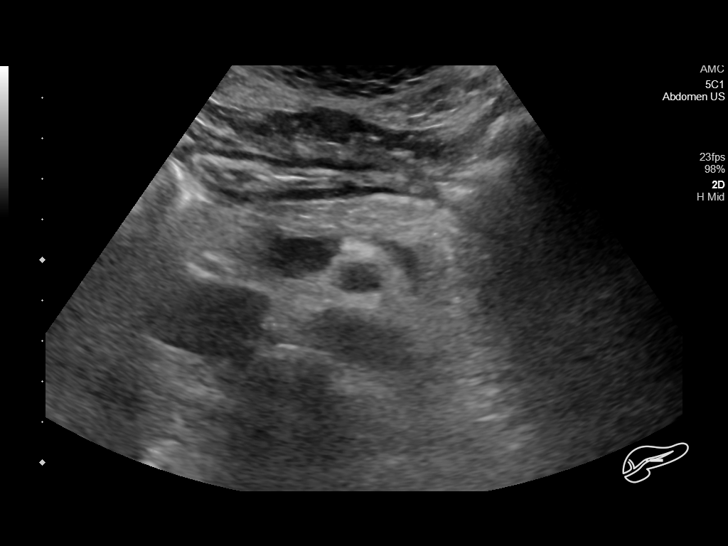
[im 33/53]
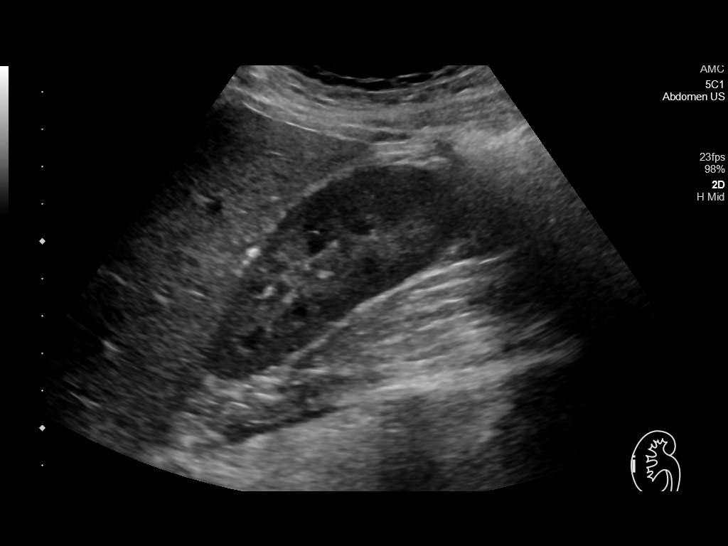
[im 35/53]
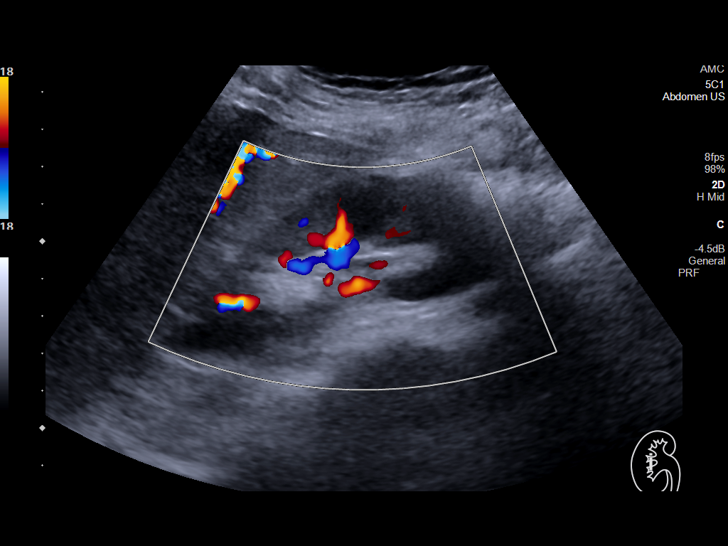
[im 40/53]
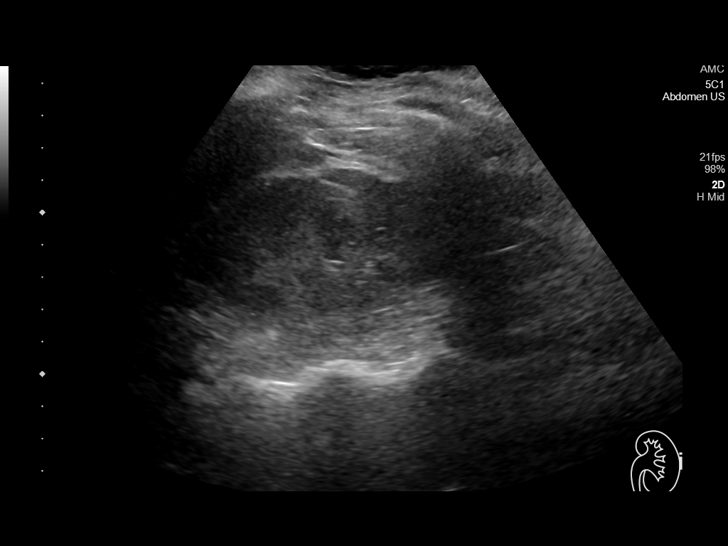
[im 44/53]
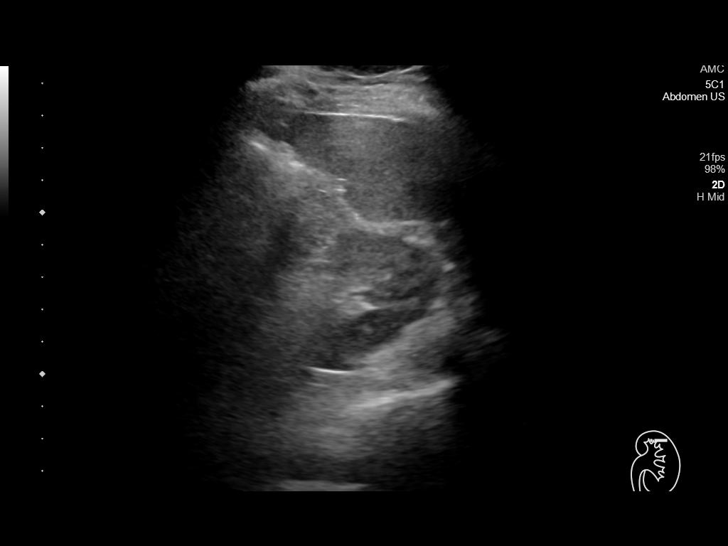
[im 48/53]
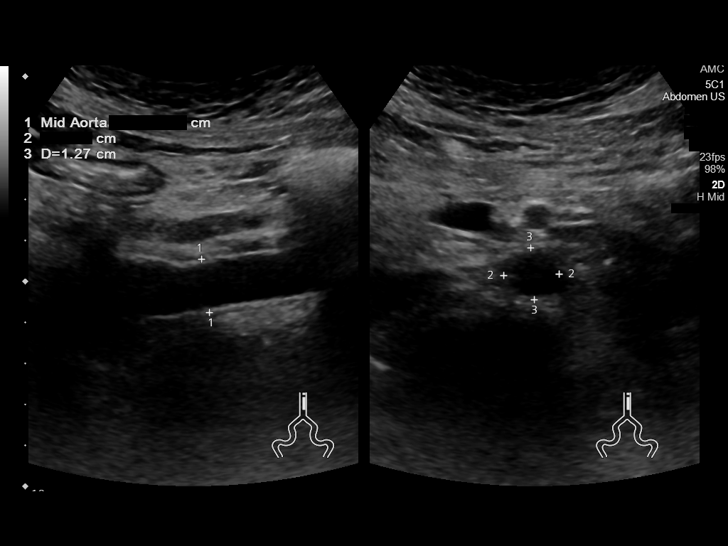
[im 53/53]
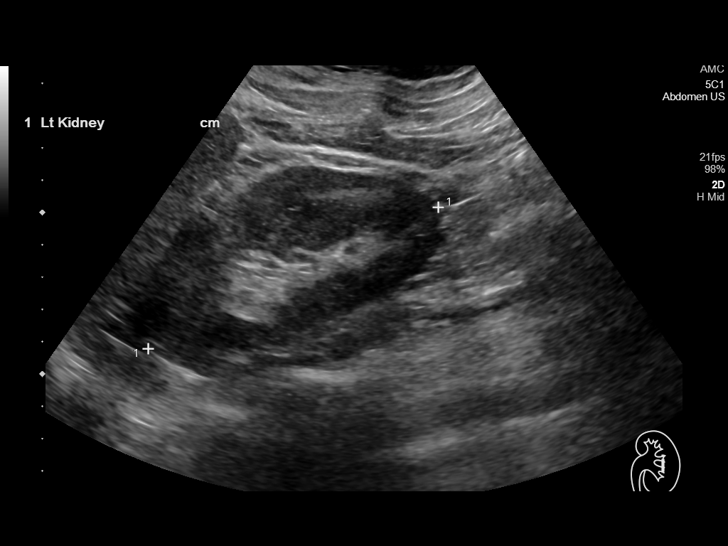

[14 of 25 positions shown; findings below may reference images not displayed]

FINDINGS: Gallbladder: Not visualized compatible with cholecystectomy.

Common bile duct: Diameter: 5 mm. No intrahepatic or extrahepatic
biliary dilatation identified.

Liver: No focal lesion identified. Within normal limits in
parenchymal echogenicity. Portal vein is patent on color Doppler
imaging with normal direction of blood flow towards the liver.

IVC: No abnormality visualized.

Pancreas: Visualized portion unremarkable.

Spleen: Size and appearance within normal limits.

Right Kidney: Length: 9.7 cm. Echogenicity within normal limits. No
mass or hydronephrosis visualized.

Left Kidney: Length: 10 cm. Echogenicity within normal limits. No
mass or hydronephrosis visualized.

Abdominal aorta: No aneurysm visualized.

Other findings: None.
IMPRESSION: Unremarkable abdominal ultrasound except for cholecystectomy.
